# Patient Record
Sex: Female | Born: 1995 | Race: Black or African American | Hispanic: No | Marital: Single | Smoking: Never smoker
Health system: Southern US, Community
[De-identification: ages and names within clinical notes are randomized; demographics above are authoritative.]

## PROBLEM LIST (undated history)

## (undated) DIAGNOSIS — R011 Cardiac murmur, unspecified: Secondary | ICD-10-CM

## (undated) DIAGNOSIS — A749 Chlamydial infection, unspecified: Secondary | ICD-10-CM

## (undated) HISTORY — PX: NO PAST SURGERIES: SHX2092

---

## 2020-06-03 ENCOUNTER — Other Ambulatory Visit: Payer: Self-pay

## 2020-06-03 ENCOUNTER — Inpatient Hospital Stay (HOSPITAL_COMMUNITY)
Admission: AD | Admit: 2020-06-03 | Discharge: 2020-06-03 | Disposition: A | Discharge status: Home / Self Care | Payer: Self-pay | Attending: Obstetrics & Gynecology | Admitting: Obstetrics & Gynecology

## 2020-06-03 ENCOUNTER — Inpatient Hospital Stay (HOSPITAL_COMMUNITY): Payer: Self-pay

## 2020-06-03 ENCOUNTER — Encounter (HOSPITAL_COMMUNITY): Payer: Self-pay | Admitting: Physician Assistant

## 2020-06-03 DIAGNOSIS — Z3A12 12 weeks gestation of pregnancy: Secondary | ICD-10-CM

## 2020-06-03 DIAGNOSIS — O26899 Other specified pregnancy related conditions, unspecified trimester: Secondary | ICD-10-CM

## 2020-06-03 DIAGNOSIS — O209 Hemorrhage in early pregnancy, unspecified: Secondary | ICD-10-CM | POA: Insufficient documentation

## 2020-06-03 DIAGNOSIS — R102 Pelvic and perineal pain: Secondary | ICD-10-CM | POA: Insufficient documentation

## 2020-06-03 DIAGNOSIS — O26891 Other specified pregnancy related conditions, first trimester: Secondary | ICD-10-CM | POA: Insufficient documentation

## 2020-06-03 DIAGNOSIS — O4690 Antepartum hemorrhage, unspecified, unspecified trimester: Secondary | ICD-10-CM

## 2020-06-03 DIAGNOSIS — O26851 Spotting complicating pregnancy, first trimester: Secondary | ICD-10-CM

## 2020-06-03 DIAGNOSIS — Z3490 Encounter for supervision of normal pregnancy, unspecified, unspecified trimester: Secondary | ICD-10-CM

## 2020-06-03 HISTORY — DX: Cardiac murmur, unspecified: R01.1

## 2020-06-03 LAB — COMPREHENSIVE METABOLIC PANEL
ALT: 20 U/L (ref 0–44)
AST: 17 U/L (ref 15–41)
Albumin: 3.2 g/dL — ABNORMAL LOW (ref 3.5–5.0)
Alkaline Phosphatase: 60 U/L (ref 38–126)
Anion gap: 8 (ref 5–15)
BUN: <5 mg/dL — ABNORMAL LOW (ref 6–20)
CO2: 24 mmol/L (ref 22–32)
Calcium: 9.1 mg/dL (ref 8.9–10.3)
Chloride: 104 mmol/L (ref 98–111)
Creatinine, Ser: 0.59 mg/dL (ref 0.44–1.00)
GFR calc Af Amer: >60 mL/min (ref >60)
GFR calc non Af Amer: >60 mL/min (ref >60)
Glucose, Bld: 86 mg/dL (ref 70–99)
Potassium: 3.8 mmol/L (ref 3.5–5.1)
Sodium: 136 mmol/L (ref 135–145)
Total Bilirubin: <0.1 mg/dL — ABNORMAL LOW (ref 0.3–1.2)
Total Protein: 6.7 g/dL (ref 6.5–8.1)

## 2020-06-03 LAB — CBC
HCT: 34.3 % — ABNORMAL LOW (ref 36.0–46.0)
Hemoglobin: 11.3 g/dL — ABNORMAL LOW (ref 12.0–15.0)
MCH: 26.6 pg (ref 26.0–34.0)
MCHC: 32.9 g/dL (ref 30.0–36.0)
MCV: 80.7 fL (ref 80.0–100.0)
Platelets: 218 10*3/uL (ref 150–400)
RBC: 4.25 MIL/uL (ref 3.87–5.11)
RDW: 15.9 % — ABNORMAL HIGH (ref 11.5–15.5)
WBC: 5.8 10*3/uL (ref 4.0–10.5)
nRBC: 0 % (ref 0.0–0.2)

## 2020-06-03 LAB — URINALYSIS, ROUTINE W REFLEX MICROSCOPIC
Bilirubin Urine: NEGATIVE
Glucose, UA: NEGATIVE mg/dL
Hgb urine dipstick: NEGATIVE
Ketones, ur: 20 mg/dL — AB
Leukocytes,Ua: NEGATIVE
Nitrite: NEGATIVE
Protein, ur: NEGATIVE mg/dL
Specific Gravity, Urine: 1.024 (ref 1.005–1.030)
pH: 6 (ref 5.0–8.0)

## 2020-06-03 LAB — WET PREP, GENITAL
Clue Cells Wet Prep HPF POC: NONE SEEN
Sperm: NONE SEEN
Trich, Wet Prep: NONE SEEN
Yeast Wet Prep HPF POC: NONE SEEN

## 2020-06-03 LAB — ABO/RH: ABO/RH(D): A POS

## 2020-06-03 LAB — POC URINE PREG, ED: Preg Test, Ur: POSITIVE — AB

## 2020-06-03 LAB — HCG, QUANTITATIVE, PREGNANCY: hCG, Beta Chain, Quant, S: 78365 m[IU]/mL — ABNORMAL HIGH (ref <5)

## 2020-06-03 MED ORDER — PREPLUS 27-1 MG PO TABS: 1.0000 | ORAL_TABLET | Freq: Every day | ORAL | 13 refills | Status: AC

## 2020-06-03 NOTE — Discharge Instructions (Signed)
Abdominal Pain During Pregnancy  Abdominal pain is common during pregnancy, and has many possible causes. Some causes are more serious than others, and sometimes the cause is not known. Abdominal pain can be a sign that labor is starting. It can also be caused by normal growth and stretching of muscles and ligaments during pregnancy. Always tell your health care provider if you have any abdominal pain. Follow these instructions at home:  Do not have sex or put anything in your vagina until your pain goes away completely.  Get plenty of rest until your pain improves.  Drink enough fluid to keep your urine pale yellow.  Take over-the-counter and prescription medicines only as told by your health care provider.  Keep all follow-up visits as told by your health care provider. This is important. Contact a health care provider if:  Your pain continues or gets worse after resting.  You have lower abdominal pain that: ? Comes and goes at regular intervals. ? Spreads to your back. ? Is similar to menstrual cramps.  You have pain or burning when you urinate. Get help right away if:  You have a fever or chills.  You have vaginal bleeding.  You are leaking fluid from your vagina.  You are passing tissue from your vagina.  You have vomiting or diarrhea that lasts for more than 24 hours.  Your baby is moving less than usual.  You feel very weak or faint.  You have shortness of breath.  You develop severe pain in your upper abdomen. Summary  Abdominal pain is common during pregnancy, and has many possible causes.  If you experience abdominal pain during pregnancy, tell your health care provider right away.  Follow your health care provider's home care instructions and keep all follow-up visits as directed. This information is not intended to replace advice given to you by your health care provider. Make sure you discuss any questions you have with your health care  provider. Document Revised: 01/12/2019 Document Reviewed: 12/27/2016 Elsevier Patient Education  2020 Elsevier Inc.  

## 2020-06-03 NOTE — MAU Provider Note (Signed)
History     CSN: [id number]  Arrival date and time: 06/03/20 1729  First Provider Initiated Contact with Patient 06/03/20 1922     Chief Complaint  Patient presents with  . Vaginal Bleeding  . Abdominal Pain   HPI April Woods is a 24 y.o. G3P2002 at [redacted]w[redacted]d who presents to MAU from [facility] for evaluation of abdominal pain and vaginal spotting. These are new problems, onset three days ago and unchanged since that time. Patient rates her lower abdominal pain as 4/10. She denies aggravating or alleviating factors. She has not taken medication or tried other treatments for these complaints. She denies abdominal tenderness, dysuria, fever or recent illness.  OB History    Gravida  3   Para  2   Term  2   Preterm  0   AB  0   Living  2     SAB  0   TAB  0   Ectopic  0   Multiple  0   Live Births  0           Past Medical History:  Diagnosis Date  . Heart murmur     Past Surgical History:  Procedure Laterality Date  . NO PAST SURGERIES      No family history on file.  Social History   Tobacco Use  . Smoking status: Never Smoker  . Smokeless tobacco: Never Used  Vaping Use  . Vaping Use: Never used  Substance Use Topics  . Alcohol use: Never  . Drug use: Never    Allergies: No Known Allergies  No medications prior to admission.    Review of Systems  Gastrointestinal: Positive for abdominal pain.  Genitourinary: Positive for vaginal bleeding.  Musculoskeletal: Negative for back pain.  All other systems reviewed and are negative.  Physical Exam   Blood pressure 110/66, pulse 86, temperature 98.6 F (37 C), resp. rate 18, height 5\' 3"  (1.6 m), weight 86.2 kg, last menstrual period 03/08/2020, SpO2 100 %.  Physical Exam Vitals and nursing note reviewed. Exam conducted with a chaperone present.  Constitutional:      Appearance: She is well-developed.  Cardiovascular:     Rate and Rhythm: Normal rate.     Heart sounds: Normal heart sounds.   Pulmonary:     Effort: Pulmonary effort is normal.     Breath sounds: Normal breath sounds.  Abdominal:     General: Abdomen is flat. Bowel sounds are normal. There is no distension.     Palpations: Abdomen is soft.     Tenderness: There is no abdominal tenderness. There is no right CVA tenderness or left CVA tenderness.  Genitourinary:    Vagina: Normal.     Cervix: Normal.     Uterus: Normal.      Comments: Pelvic exam: External genitalia normal, vaginal walls pink and well rugated, cervix visually closed, no lesions noted.   Skin:    General: Skin is warm and dry.     Capillary Refill: Capillary refill takes less than 2 seconds.  Neurological:     General: No focal deficit present.     Mental Status: She is alert.     MAU Course  Procedures: speculum exam, ultrasound  Patient Vitals for the past 24 hrs:  BP Temp Temp src Pulse Resp SpO2 Height Weight  06/03/20 2033 109/65 -- -- 88 17 100 % -- --  06/03/20 1854 110/66 -- -- -- -- -- -- --  06/03/20 1853 -- 98.6  F (37 C) -- 86 18 -- -- --  06/03/20 1741 116/73 99.3 F (37.4 C) Oral 93 16 100 % 5\' 3"  (1.6 m) 86.2 kg   Results for orders placed or performed during the hospital encounter of 06/03/20 (from the past 24 hour(s))  POC Urine Pregnancy, ED (not at [facility])     Status: Abnormal   Collection Time: 06/03/20  6:00 PM  Result Value Ref Range   Preg Test, Ur POSITIVE (A) NEGATIVE  Urinalysis, Routine w reflex microscopic     Status: Abnormal   Collection Time: 06/03/20  7:27 PM  Result Value Ref Range   Color, Urine YELLOW YELLOW   APPearance CLEAR CLEAR   Specific Gravity, Urine 1.024 1.005 - 1.030   pH 6.0 5.0 - 8.0   Glucose, UA NEGATIVE NEGATIVE mg/dL   Hgb urine dipstick NEGATIVE NEGATIVE   Bilirubin Urine NEGATIVE NEGATIVE   Ketones, ur 20 (A) NEGATIVE mg/dL   Protein, ur NEGATIVE NEGATIVE mg/dL   Nitrite NEGATIVE NEGATIVE   Leukocytes,Ua NEGATIVE NEGATIVE  Wet prep, genital     Status: Abnormal    Collection Time: 06/03/20  7:30 PM   Specimen: PATH Cytology Cervicovaginal Ancillary Only  Result Value Ref Range   Yeast Wet Prep HPF POC NONE SEEN NONE SEEN   Trich, Wet Prep NONE SEEN NONE SEEN   Clue Cells Wet Prep HPF POC NONE SEEN NONE SEEN   WBC, Wet Prep HPF POC MANY (A) NONE SEEN   Sperm NONE SEEN   CBC     Status: Abnormal   Collection Time: 06/03/20  7:46 PM  Result Value Ref Range   WBC 5.8 4.0 - 10.5 K/uL   RBC 4.25 3.87 - 5.11 MIL/uL   Hemoglobin 11.3 (L) 12.0 - 15.0 g/dL   HCT 06/05/20 (L) 36 - 46 %   MCV 80.7 80.0 - 100.0 fL   MCH 26.6 26.0 - 34.0 pg   MCHC 32.9 30.0 - 36.0 g/dL   RDW 54.0 (H) 08.6 - 76.1 %   Platelets 218 150 - 400 K/uL   nRBC 0.0 0.0 - 0.2 %  ABO/Rh     Status: None   Collection Time: 06/03/20  7:46 PM  Result Value Ref Range   ABO/RH(D)      A POS Performed at [facility], [address] [place], [place]   Comprehensive metabolic panel     Status: Abnormal   Collection Time: 06/03/20  7:46 PM  Result Value Ref Range   Sodium 136 135 - 145 mmol/L   Potassium 3.8 3.5 - 5.1 mmol/L   Chloride 104 98 - 111 mmol/L   CO2 24 22 - 32 mmol/L   Glucose, Bld 86 70 - 99 mg/dL   BUN <5 (L) 6 - 20 mg/dL   Creatinine, Ser 06/05/20 0.44 - 1.00 mg/dL   Calcium 9.1 8.9 - 1.24 mg/dL   Total Protein 6.7 6.5 - 8.1 g/dL   Albumin 3.2 (L) 3.5 - 5.0 g/dL   AST 17 15 - 41 U/L   ALT 20 0 - 44 U/L   Alkaline Phosphatase 60 38 - 126 U/L   Total Bilirubin <0.1 (L) 0.3 - 1.2 mg/dL   GFR calc non Af Amer >60 >60 mL/min   GFR calc Af Amer >60 >60 mL/min   Anion gap 8 5 - 15  hCG, quantitative, pregnancy     Status: Abnormal   Collection Time: 06/03/20  7:46 PM  Result Value  Ref Range   hCG, Beta Chain, Quant, S 78,365 (H) <5 mIU/mL   US OB Comp Less 14 Wks  Result Date: 06/03/2020 CLINICAL DATA:  Vaginal spotting and pelvic cramping. EXAM: OBSTETRIC <14 WK ULTRASOUND TECHNIQUE: Transabdominal ultrasound was performed for evaluation of the gestation  as well as the maternal uterus and adnexal regions. COMPARISON:  None. FINDINGS: Intrauterine gestational sac: Single Yolk sac:  Not Visualized. Embryo:  Visualized. Cardiac Activity: Visualized. Heart Rate: 145 bpm CRL:   54.4 mm   12 w 0 d                  [place] EDC: December 16, 2020 Subchorionic hemorrhage:  None visualized. Maternal uterus/adnexae: The right and left ovaries are visualized and are normal in appearance. No free fluid is identified. IMPRESSION: Single, viable intrauterine pregnancy at approximately 12 weeks and 0 days gestation by ultrasound evaluation. Electronically Signed   By: Aram Candela M.D.   On: 06/03/2020 20:05   Meds ordered this encounter  Medications  . Prenatal Vit-Fe Fumarate-FA (PREPLUS) 27-1 MG TABS    Sig: Take 1 tablet by mouth daily.    Dispense:  30 tablet    Refill:  13    Order Specific Question:   Supervising Provider    Answer:   Lazaro Arms [[id number]]   Assessment and Plan  --24 y.o. G4W1027 with SIUP at [redacted]w[redacted]d  --No concerning findings on physical exam --Discharge home in stable condition  Calvert Cantor, CNM 06/03/2020, 9:06 PM

## 2020-06-03 NOTE — MAU Note (Signed)
.   April Woods is a 24 y.o. at [redacted]w[redacted]d here in MAU reporting: vaginal spotting and lower abdominal cramping for 3 days.  LMP: 03/08/20 Onset of complaint: 3 days Pain score: 4 Vitals:   06/03/20 1853 06/03/20 1854  BP:  110/66  Pulse: 86   Resp: 18   Temp: 98.6 F (37 C)   SpO2:       FHT: Lab orders placed from triage: UA

## 2020-06-03 NOTE — ED Triage Notes (Signed)
Emergency Medicine Provider OB Triage Evaluation Note  April Woods is a 24 y.o. female, G3P2 [redacted]w[redacted]d by LMP ., at Unknown gestation who presents to the emergency department with complaints of Vaginal spotting for three days.  She reports pelvic pain and cramping for two days.  No fevers.  First day LMP June 1st.    Review of  Systems  Positive: Cramping abdominal pain.  Spotting Negative: No CP, SHOB, fevers, urinary symptoms.    Physical Exam  BP 116/73 (BP Location: Right Arm)   Pulse 93   Temp 99.3 F (37.4 C) (Oral)   Resp 16   Ht 5\' 3"  (1.6 m)   Wt 86.2 kg   SpO2 100%   BMI 33.66 kg/m  General: Awake, no distress  HEENT: Atraumatic  Resp: Normal effort  Cardiac: Normal rate Abd: Nondistended, nontender  MSK: Moves all extremities without difficulty Neuro: Speech clear  Medical Decision Making  Pt evaluated for pregnancy concern and is stable for transfer to MAU. Pt is in agreement with plan for transfer.  Positive upreg here.   6:28 PM Discussed with MAU APP, Sam, who accepts patient in transfer.  632 pm: Triage RN is busy, I called MAU (with awareness of triage RN) and spoke with MAU RN (to satisfy RN to RN communication requirement) MAU RN is in aware.   Clinical Impression   1. Vaginal bleeding in pregnancy        , Cristina Gong 06/03/20 1837

## 2020-06-06 LAB — GC/CHLAMYDIA PROBE AMP (~~LOC~~) NOT AT ARMC
Chlamydia: NEGATIVE
Comment: NEGATIVE
Comment: NORMAL
Neisseria Gonorrhea: NEGATIVE

## 2020-06-14 ENCOUNTER — Encounter (HOSPITAL_COMMUNITY): Payer: Self-pay | Admitting: Obstetrics & Gynecology

## 2020-06-14 ENCOUNTER — Inpatient Hospital Stay (HOSPITAL_COMMUNITY)
Admission: AD | Admit: 2020-06-14 | Discharge: 2020-06-14 | Disposition: A | Discharge status: Home / Self Care | Payer: Self-pay | Attending: Obstetrics & Gynecology | Admitting: Obstetrics & Gynecology

## 2020-06-14 ENCOUNTER — Other Ambulatory Visit: Payer: Self-pay

## 2020-06-14 DIAGNOSIS — O219 Vomiting of pregnancy, unspecified: Secondary | ICD-10-CM | POA: Insufficient documentation

## 2020-06-14 DIAGNOSIS — Z3A14 14 weeks gestation of pregnancy: Secondary | ICD-10-CM | POA: Insufficient documentation

## 2020-06-14 DIAGNOSIS — R109 Unspecified abdominal pain: Secondary | ICD-10-CM | POA: Insufficient documentation

## 2020-06-14 DIAGNOSIS — O26892 Other specified pregnancy related conditions, second trimester: Secondary | ICD-10-CM | POA: Insufficient documentation

## 2020-06-14 HISTORY — DX: Chlamydial infection, unspecified: A74.9

## 2020-06-14 LAB — URINALYSIS, ROUTINE W REFLEX MICROSCOPIC
Bilirubin Urine: NEGATIVE
Glucose, UA: NEGATIVE mg/dL
Hgb urine dipstick: NEGATIVE
Ketones, ur: 5 mg/dL — AB
Nitrite: NEGATIVE
Protein, ur: 30 mg/dL — AB
Specific Gravity, Urine: 1.029 (ref 1.005–1.030)
pH: 7 (ref 5.0–8.0)

## 2020-06-14 MED ORDER — PROMETHAZINE HCL 25 MG PO TABS
25.0000 mg | ORAL_TABLET | Freq: Once | ORAL | Status: AC
  Administered 2020-06-14: 25 mg via ORAL
  Filled 2020-06-14: qty 1

## 2020-06-14 MED ORDER — PROMETHAZINE HCL 25 MG PO TABS: 12.5000 mg | ORAL_TABLET | Freq: Four times a day (QID) | ORAL | 2 refills | Status: AC | PRN

## 2020-06-14 NOTE — MAU Note (Signed)
Ongoing vomiting, has not thrown up while waiting, "feels like she is going to".  Has cramping after she vomits, mild currently.

## 2020-06-14 NOTE — MAU Provider Note (Addendum)
Chief Complaint: Emesis, Nausea, and Cramping   First Provider Initiated Contact with Patient 06/14/20 1949      SUBJECTIVE HPI: April Woods is a 24 y.o. G3P2002 at [redacted]w[redacted]d by LMP who presents to maternity admissions reporting nausea, vomiting, and abdominal cramping. She reports vomiting x 3 in 24 hours, "every time I tried to eat". She has not tried any treatments. She did not have this much nausea with her previous pregnancies.   Location:lower abdomen Quality: cramping Severity: 5/10 on pain scale Duration: 1 day Timing: intermittent, usually after vomiting Modifying factors: none Associated signs and symptoms: n/v  HPI  Past Medical History:  Diagnosis Date  . Chlamydia   . Heart murmur    Past Surgical History:  Procedure Laterality Date  . NO PAST SURGERIES     Social History   Socioeconomic History  . Marital status: Single    Spouse name: Not on file  . Number of children: Not on file  . Years of education: Not on file  . Highest education level: Not on file  Occupational History  . Not on file  Tobacco Use  . Smoking status: Never Smoker  . Smokeless tobacco: Never Used  Vaping Use  . Vaping Use: Never used  Substance and Sexual Activity  . Alcohol use: Never  . Drug use: Never  . Sexual activity: Not Currently  Other Topics Concern  . Not on file  Social History Narrative  . Not on file   Social Determinants of Health   Financial Resource Strain:   . Difficulty of Paying Living Expenses: Not on file  Food Insecurity:   . Worried About Programme researcher, broadcasting/film/video in the Last Year: Not on file  . Ran Out of Food in the Last Year: Not on file  Transportation Needs:   . Lack of Transportation (Medical): Not on file  . Lack of Transportation (Non-Medical): Not on file  Physical Activity:   . Days of Exercise per Week: Not on file  . Minutes of Exercise per Session: Not on file  Stress:   . Feeling of Stress : Not on file  Social Connections:   .  Frequency of Communication with Friends and Family: Not on file  . Frequency of Social Gatherings with Friends and Family: Not on file  . Attends Religious Services: Not on file  . Active Member of Clubs or Organizations: Not on file  . Attends Banker Meetings: Not on file  . Marital Status: Not on file  Intimate Partner Violence:   . Fear of Current or Ex-Partner: Not on file  . Emotionally Abused: Not on file  . Physically Abused: Not on file  . Sexually Abused: Not on file   No current facility-administered medications on file prior to encounter.   Current Outpatient Medications on File Prior to Encounter  Medication Sig Dispense Refill  . Prenatal Vit-Fe Fumarate-FA (PREPLUS) 27-1 MG TABS Take 1 tablet by mouth daily. 30 tablet 13   No Known Allergies  ROS:  Review of Systems  Constitutional: Negative for chills, fatigue and fever.  Respiratory: Negative for shortness of breath.   Cardiovascular: Negative for chest pain.  Gastrointestinal: Positive for abdominal pain, nausea and vomiting.  Genitourinary: Negative for difficulty urinating, dysuria, flank pain, pelvic pain, vaginal bleeding, vaginal discharge and vaginal pain.  Neurological: Negative for dizziness and headaches.  Psychiatric/Behavioral: Negative.      I have reviewed patient's Past Medical Hx, Surgical Hx, Family Hx, Social Hx,  medications and allergies.   Physical Exam   Patient Vitals for the past 24 hrs:  BP Pulse  06/14/20 2137 111/60 74  06/14/20 1847 93/65 81   Constitutional: Well-developed, well-nourished female in no acute distress.  Cardiovascular: normal rate Respiratory: normal effort GI: Abd soft, non-tender. Pos BS x 4 MS: Extremities nontender, no edema, normal ROM Neurologic: Alert and oriented x 4.  GU: Neg CVAT.  PELVIC EXAM: Deferred  FHT 154 by doppler  LAB RESULTS No results found for this or any previous visit (from the past 24 hour(s)).  --/--/A  POS Performed at [facility], [address], [place], [place] [address]  [phone]/27 [phone])  IMAGING US OB Comp Less 14 Wks  Result Date: 06/03/2020 CLINICAL DATA:  Vaginal spotting and pelvic cramping. EXAM: OBSTETRIC <14 WK ULTRASOUND TECHNIQUE: Transabdominal ultrasound was performed for evaluation of the gestation as well as the maternal uterus and adnexal regions. COMPARISON:  None. FINDINGS: Intrauterine gestational sac: Single Yolk sac:  Not Visualized. Embryo:  Visualized. Cardiac Activity: Visualized. Heart Rate: 145 bpm CRL:   54.4 mm   12 w 0 d                  [place] EDC: December 16, 2020 Subchorionic hemorrhage:  None visualized. Maternal uterus/adnexae: The right and left ovaries are visualized and are normal in appearance. No free fluid is identified. IMPRESSION: Single, viable intrauterine pregnancy at approximately 12 weeks and 0 days gestation by ultrasound evaluation. Electronically Signed   By: Aram Candela M.D.   On: 06/03/2020 20:05    MAU Management/MDM: Orders Placed This Encounter  Procedures  . Culture, OB Urine  . Urinalysis, Routine w reflex microscopic Urine, Clean Catch  . Discharge patient    Meds ordered this encounter  Medications  . promethazine (PHENERGAN) tablet 25 mg  . promethazine (PHENERGAN) 25 MG tablet    Sig: Take 0.5-1 tablets (12.5-25 mg total) by mouth every 6 (six) hours as needed for nausea.    Dispense:  30 tablet    Refill:  2    Order Specific Question:   Supervising Provider    Answer:   Jaynie Collins A [[id number]]    Previous US confirmed IUP. Pt with nausea/vomiting but emesis x 3 in 24 hours and ua shows 5 of ketones only.  Oral medication given in MAU with Phenergan 25 mg PO with improvement in nausea.  Pt tolerated PO food/fluids in MAU.  D/C home with Rx for Phenerga 12.5-25 mg Q 6 hours PRN nausea. F/U with early prenatal care at [facility] as desired.  Pt discharged with strict return precautions.  ASSESSMENT 1. Nausea and  vomiting during pregnancy prior to [redacted] weeks gestation     PLAN Discharge home Allergies as of 06/14/2020   No Known Allergies     Medication List    TAKE these medications   PrePLUS 27-1 MG Tabs Take 1 tablet by mouth daily.   promethazine 25 MG tablet Commonly known as: PHENERGAN Take 0.5-1 tablets (12.5-25 mg total) by mouth every 6 (six) hours as needed for nausea.       Follow-up Information    [facility] Follow up.   Specialty: Obstetrics and Gynecology Why: The office will call you to schedule prenatal care or call the number listed.  Contact information: [address] [place] [address] [phone]              Misty Stanley  [place] Certified Nurse-Midwife 06/15/2020  4:47 PM

## 2020-06-14 NOTE — MAU Note (Signed)
Presents with c/o N/V, has vomited 3x in 24 hours.  Reports unable to keep anything down.  Also reports abdominal cramping.  Denies VB.

## 2020-06-15 LAB — CULTURE, OB URINE: Culture: NO GROWTH

## 2020-06-16 ENCOUNTER — Telehealth: Payer: Self-pay | Admitting: General Practice

## 2020-06-16 NOTE — Telephone Encounter (Signed)
-----   Message from Hurshel Party, CNM sent at 06/15/2020  4:50 PM EDT ----- Regarding: New OB appt This [id number] at 14 weeks wants to begin prenatal care at [facility]. She is waiting for pregnancy Medicaid.  She reports low risk pregnancies with her previous and is having n/v with this pregnancy and was seen in MAU.  Please call her with first available appt.  Thank you.

## 2020-06-16 NOTE — Telephone Encounter (Signed)
Left message on VM for patient to give our office a call to schedule NOB appt.

## 2021-09-13 IMAGING — US US OB COMP LESS 14 WK
1 series · 15 of 28 positions shown · non-contrast
Comparison: None.

CLINICAL DATA: Vaginal spotting and pelvic cramping.

EXAM:
OBSTETRIC <14 WK ULTRASOUND
TECHNIQUE: Transabdominal ultrasound was performed for evaluation of the
gestation as well as the maternal uterus and adnexal regions.

[Series 1: us ob comp less 14 wk · 15 of 31 slices shown]
[im 1/31]
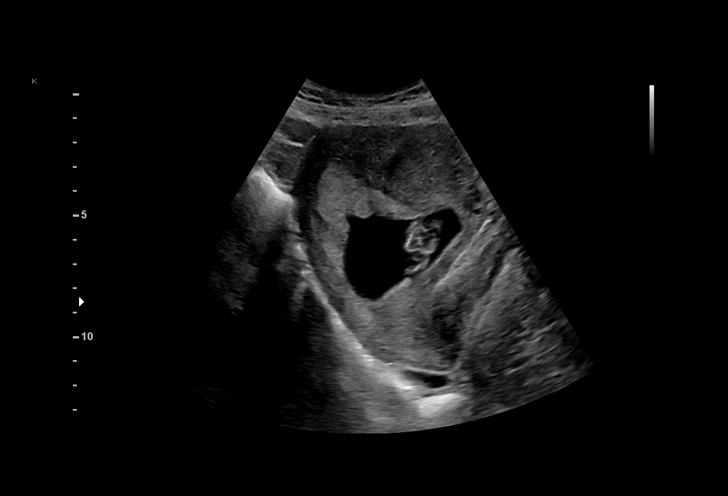
[im 3/31]
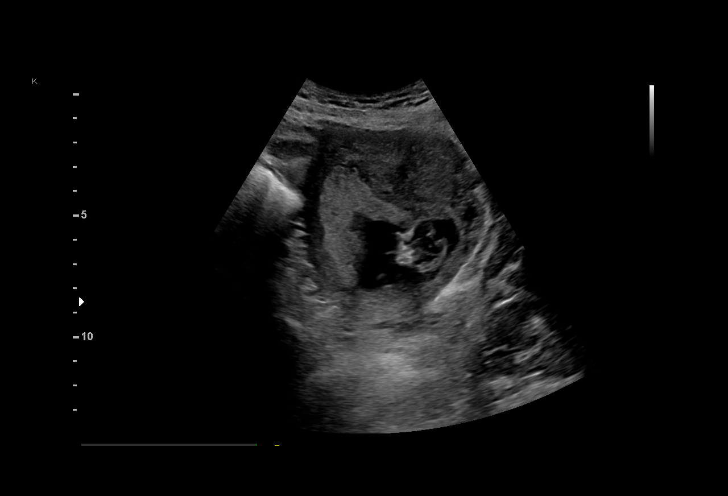
[im 5/31]
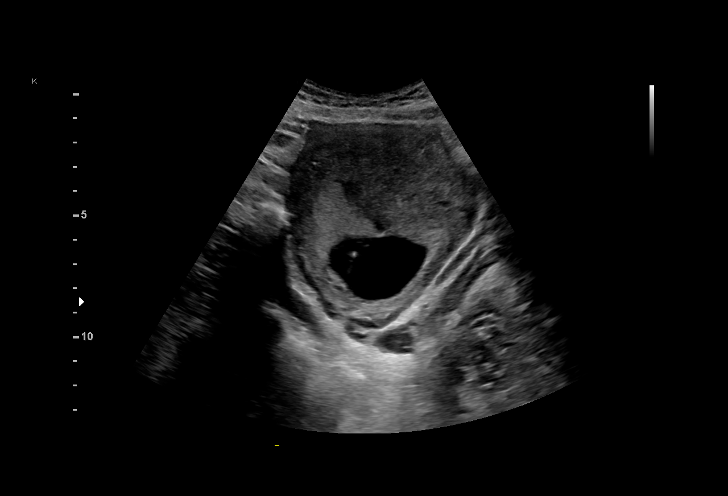
[im 7/31]
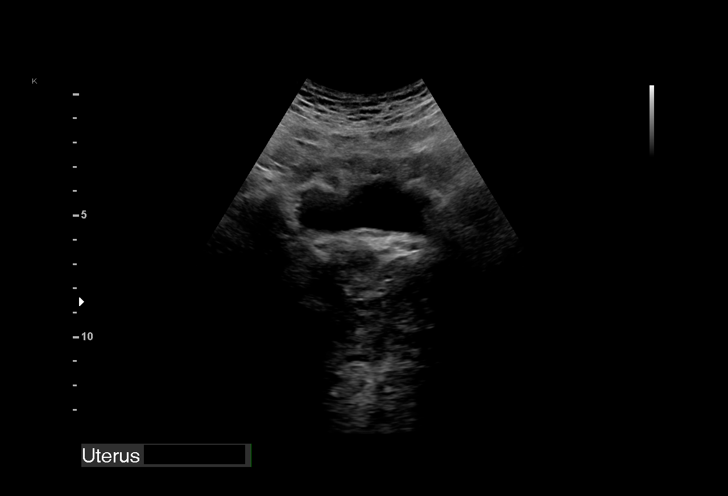
[im 9/31]
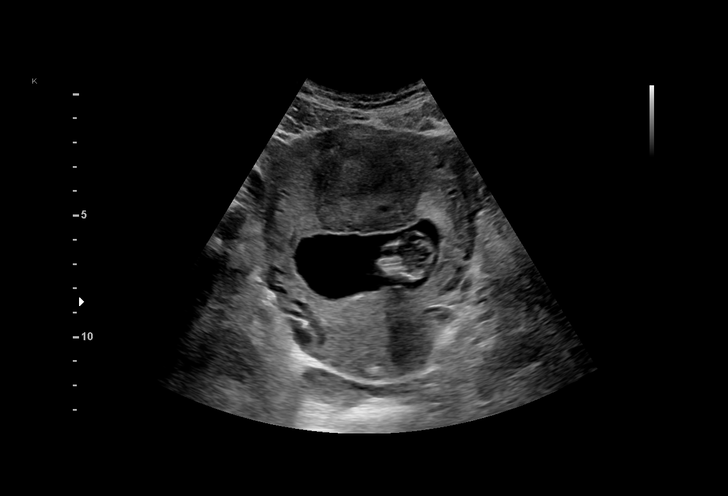
[im 12/31]
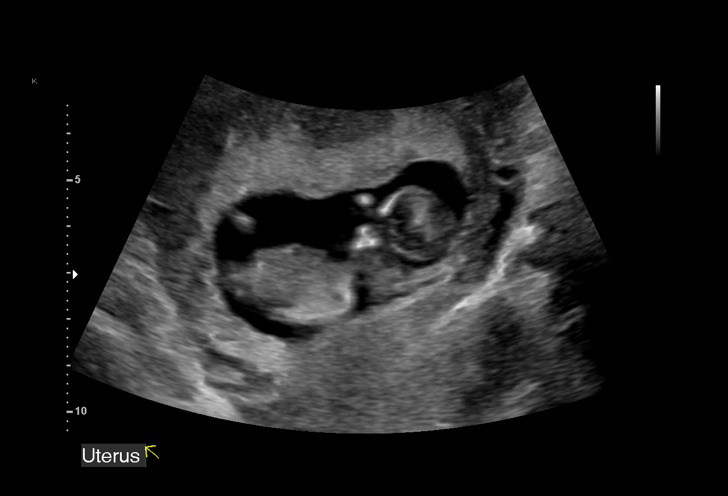
[im 14/31]
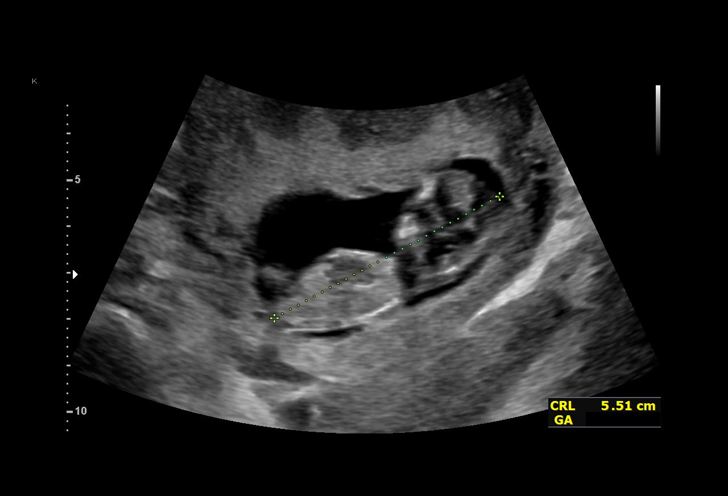
[im 16/31]
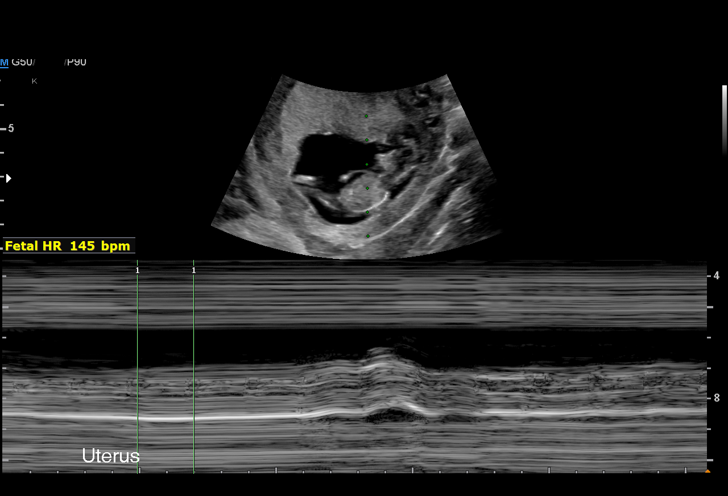
[im 17/31]
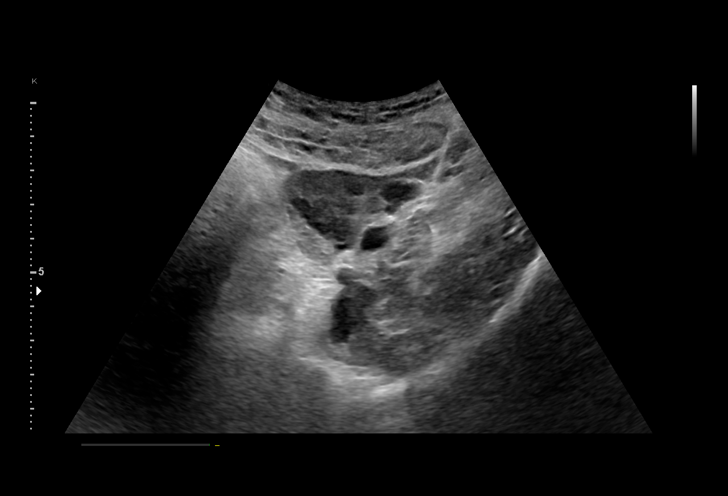
[im 19/31]
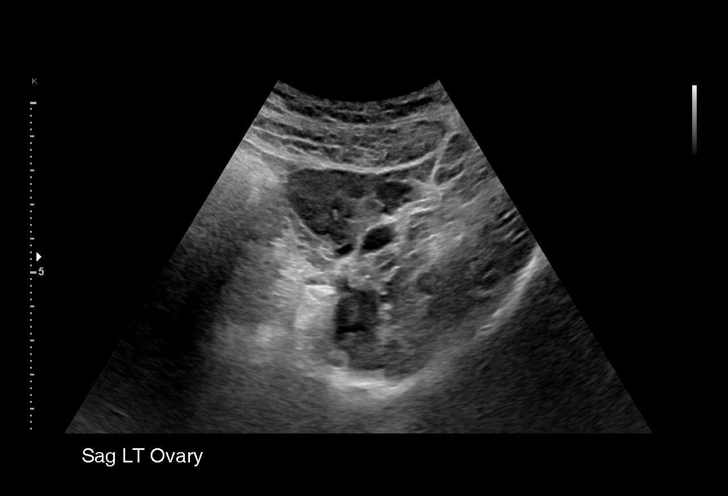
[im 22/31]
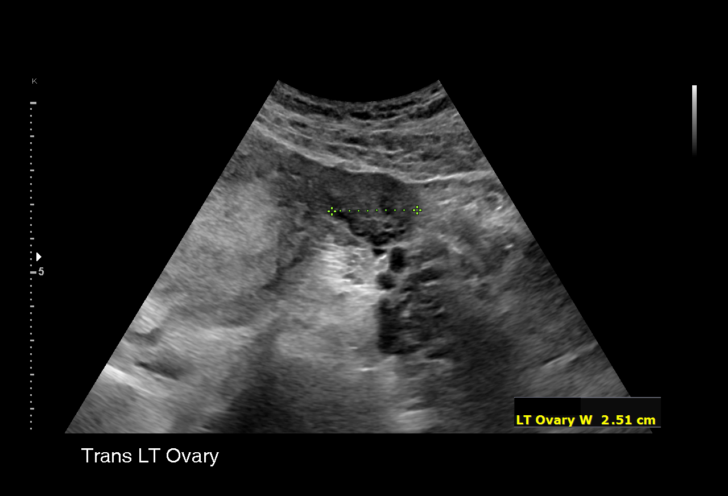
[im 24/31]
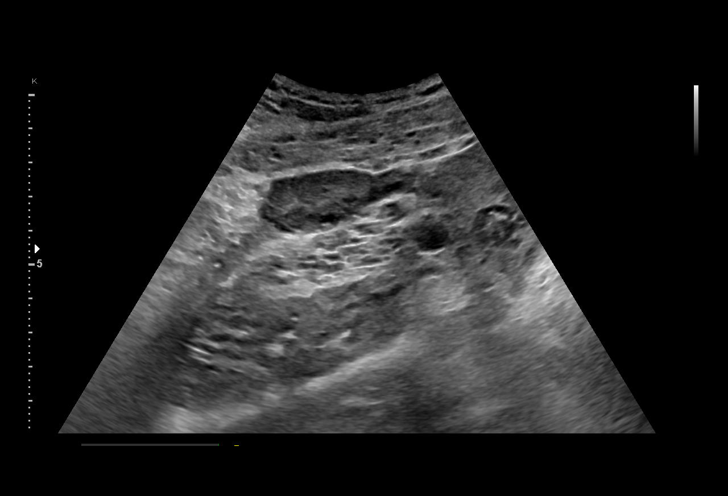
[im 26/31]
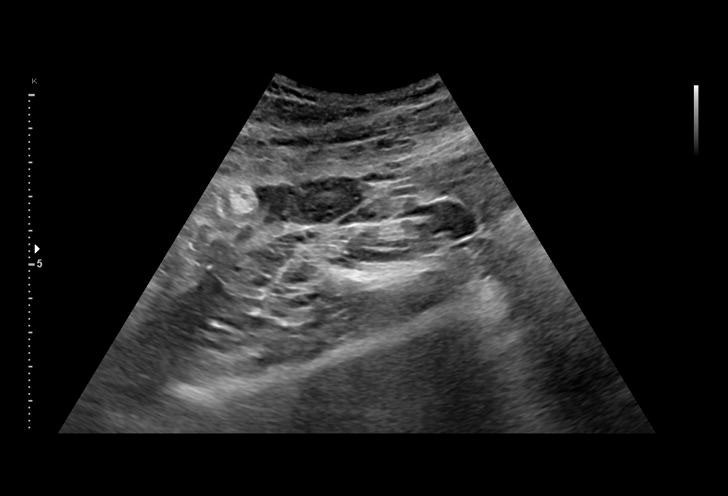
[im 28/31]
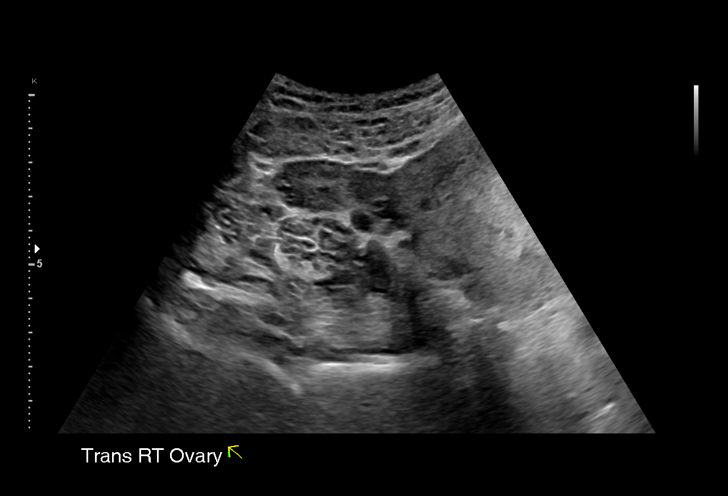
[im 31/31]
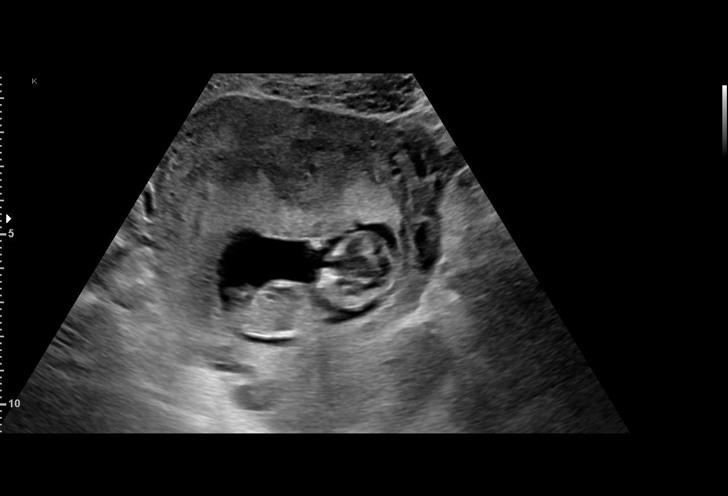

[15 of 28 positions shown; findings below may reference images not displayed]

FINDINGS: Intrauterine gestational sac: Single

Yolk sac:  Not Visualized.

Embryo:  Visualized.

Cardiac Activity: Visualized.

Heart Rate: 145 bpm

CRL:   54.4 mm   12 w 0 d                  US EDC: December 16, 2020

Subchorionic hemorrhage:  None visualized.

Maternal uterus/adnexae: The right and left ovaries are visualized
and are normal in appearance.

No free fluid is identified.
IMPRESSION: Single, viable intrauterine pregnancy at approximately 12 weeks and
0 days gestation by ultrasound evaluation.
